# Patient Record
Sex: Male | Born: 1985 | Race: White | Hispanic: No | Marital: Married | Smoking: Current some day smoker
Health system: Southern US, Community
[De-identification: ages and names within clinical notes are randomized; demographics above are authoritative.]

## PROBLEM LIST (undated history)

## (undated) DIAGNOSIS — B009 Herpesviral infection, unspecified: Secondary | ICD-10-CM

## (undated) DIAGNOSIS — K219 Gastro-esophageal reflux disease without esophagitis: Secondary | ICD-10-CM

## (undated) DIAGNOSIS — F329 Major depressive disorder, single episode, unspecified: Secondary | ICD-10-CM

## (undated) DIAGNOSIS — F32A Depression, unspecified: Secondary | ICD-10-CM

## (undated) DIAGNOSIS — F419 Anxiety disorder, unspecified: Secondary | ICD-10-CM

## (undated) HISTORY — DX: Herpesviral infection, unspecified: B00.9

## (undated) HISTORY — PX: SHOULDER SURGERY: SHX246

## (undated) HISTORY — DX: Anxiety disorder, unspecified: F41.9

## (undated) HISTORY — DX: Gastro-esophageal reflux disease without esophagitis: K21.9

## (undated) HISTORY — PX: HAND SURGERY: SHX662

---

## 2016-09-28 ENCOUNTER — Emergency Department
Admission: EM | Admit: 2016-09-28 | Discharge: 2016-09-28 | Disposition: A | Discharge status: Home / Self Care | Payer: Self-pay | Source: Home / Self Care | Attending: Family Medicine | Admitting: Family Medicine

## 2016-09-28 ENCOUNTER — Emergency Department (INDEPENDENT_AMBULATORY_CARE_PROVIDER_SITE_OTHER): Payer: Self-pay

## 2016-09-28 DIAGNOSIS — S93401A Sprain of unspecified ligament of right ankle, initial encounter: Secondary | ICD-10-CM

## 2016-09-28 DIAGNOSIS — M79671 Pain in right foot: Secondary | ICD-10-CM

## 2016-09-28 DIAGNOSIS — M25571 Pain in right ankle and joints of right foot: Secondary | ICD-10-CM

## 2016-09-28 HISTORY — DX: Depression, unspecified: F32.A

## 2016-09-28 HISTORY — DX: Major depressive disorder, single episode, unspecified: F32.9

## 2016-09-28 NOTE — ED Provider Notes (Signed)
CSN: [id number]     Arrival date & time 09/28/16  1712 History   First MD Initiated Contact with Patient 09/28/16 1734     Chief Complaint  Patient presents with  . Ankle Pain   (Consider location/radiation/quality/duration/timing/severity/associated sxs/prior Treatment) HPI  Michael Manning is a 31 y.o. male presenting to UC with c/o Right ankle and foot pain that has been intermittent for about 2 weeks.  He reports initially rolling his ankle after stepping on a rock.  This morning he rolled it on the stairs.  Pain is aching and sore, 7/10. worse with ambulation.  He has tried tylenol and motrin with mild relief. No other injuries. No hx of ankle fracture or surgery in the past.    Past Medical History:  Diagnosis Date  . Depression    Past Surgical History:  Procedure Laterality Date  . HAND SURGERY    . SHOULDER SURGERY     Family History  Problem Relation Age of Onset  . Cancer Mother   . Depression Mother   . Hypertension Father   . Restless legs syndrome Father   . Depression Father   . Epilepsy Sister   . Depression Sister    Social History  Substance Use Topics  . Smoking status: Current Some Day Smoker  . Smokeless tobacco: Not on file  . Alcohol use 1.2 oz/week    2 Standard drinks or equivalent per week    Review of Systems  Musculoskeletal: Positive for arthralgias, joint swelling and myalgias. Negative for gait problem.  Skin: Negative for color change and wound.  Neurological: Negative for weakness and numbness.    Allergies  Klonopin [clonazepam]  Home Medications   Prior to Admission medications   Medication Sig Start Date End Date Taking? Authorizing Provider  busPIRone (BUSPAR) 15 MG tablet Take 15 mg by mouth 3 (three) times daily.   Yes [provider]  omeprazole (PRILOSEC) 20 MG capsule Take 20 mg by mouth daily.   Yes [provider]  sertraline (ZOLOFT) 100 MG tablet Take 100 mg by mouth daily.   Yes [provider]   valACYclovir (VALTREX) 500 MG tablet Take 500 mg by mouth 2 (two) times daily.   Yes [provider]   Meds Ordered and Administered this Visit  Medications - No data to display  BP 125/90 (BP Location: Left Arm)   Pulse 95   Temp 97.8 F (36.6 C) (Oral)   Resp 18   Ht 6\' 2"  (1.88 m)   Wt 297 lb 8 oz (134.9 kg)   SpO2 100%   BMI 38.20 kg/m  No data found.   Physical Exam  Constitutional: He is oriented to person, place, and time. He appears well-developed and well-nourished. No distress.  HENT:  Head: Normocephalic and atraumatic.  Eyes: EOM are normal.  Neck: Normal range of motion.  Cardiovascular: Normal rate.   Pulses:      Dorsalis pedis pulses are 2+ on the right side.       Posterior tibial pulses are 2+ on the right side.  Pulmonary/Chest: Effort normal.  Musculoskeletal: Normal range of motion. He exhibits tenderness. He exhibits no edema.       Feet:  Right ankle and foot: tenderness to medial, alteral and dorsal ankle joint. No obvious edema compared to Left. Full ROM.  Calf is soft, non-tender.   Neurological: He is alert and oriented to person, place, and time.  Skin: Skin is warm and dry. Capillary refill takes  less than 2 seconds. No rash noted. He is not diaphoretic. No erythema.  Right ankle and foot: skin in tact. No ecchymosis or erythema   Psychiatric: He has a normal mood and affect. His behavior is normal.  Nursing note and vitals reviewed.   Urgent Care Course     Procedures (including critical care time)  Labs Review Labs Reviewed - No data to display  Imaging Review Dg Ankle Complete Right  Result Date: 09/28/2016 CLINICAL DATA:  Right foot/ankle injury 1 week ago, pain EXAM: RIGHT ANKLE - COMPLETE 3+ VIEW COMPARISON:  None. FINDINGS: No fracture or dislocation is seen. The ankle mortise is intact. The base of the fifth metatarsal is unremarkable. The visualized soft tissues are unremarkable. IMPRESSION: Negative. Electronically  Signed   By: Charline BillsSriyesh  Krishnan M.D.   On: 09/28/2016 18:01   Dg Foot Complete Right  Result Date: 09/28/2016 CLINICAL DATA:  Right foot/ ankle injury 1 week ago, pain EXAM: RIGHT FOOT COMPLETE - 3+ VIEW COMPARISON:  None. FINDINGS: No fracture or dislocation is seen. The joint spaces are preserved. The visualized soft tissues are unremarkable. IMPRESSION: Negative. Electronically Signed   By: Charline BillsSriyesh  Krishnan M.D.   On: 09/28/2016 18:00     MDM   1. Sprain of right ankle, unspecified ligament, initial encounter   2. Right foot pain   3. Right ankle pain    Hx and exam with imaging c/w Right ankle sprain.  Ankle brace applied for comfort. Pt declined crutches.  Home care instructions for ankle sprain provided.   F/u with Sports Medicine in 1-2 weeks if not improving, sooner if worsening.    Junius FinnerO'Malley, Dara Camargo, PA-C 09/28/16 Rickey [id number]

## 2016-09-28 NOTE — ED Triage Notes (Signed)
Pt with right foot/ankle pain that's been on and off for 2 weeks. States he initially rolled his ankle and landed on it 2 weeks ago, but he keeps rolling it and pain is increasing.

## 2018-05-15 IMAGING — DX DG ANKLE COMPLETE 3+V*R*
3 series · 3 of 3 positions shown · non-contrast
Comparison: None.

CLINICAL DATA: Right foot/ankle injury 1 week ago, pain

EXAM:
RIGHT ANKLE - COMPLETE 3+ VIEW

[ankle ap]
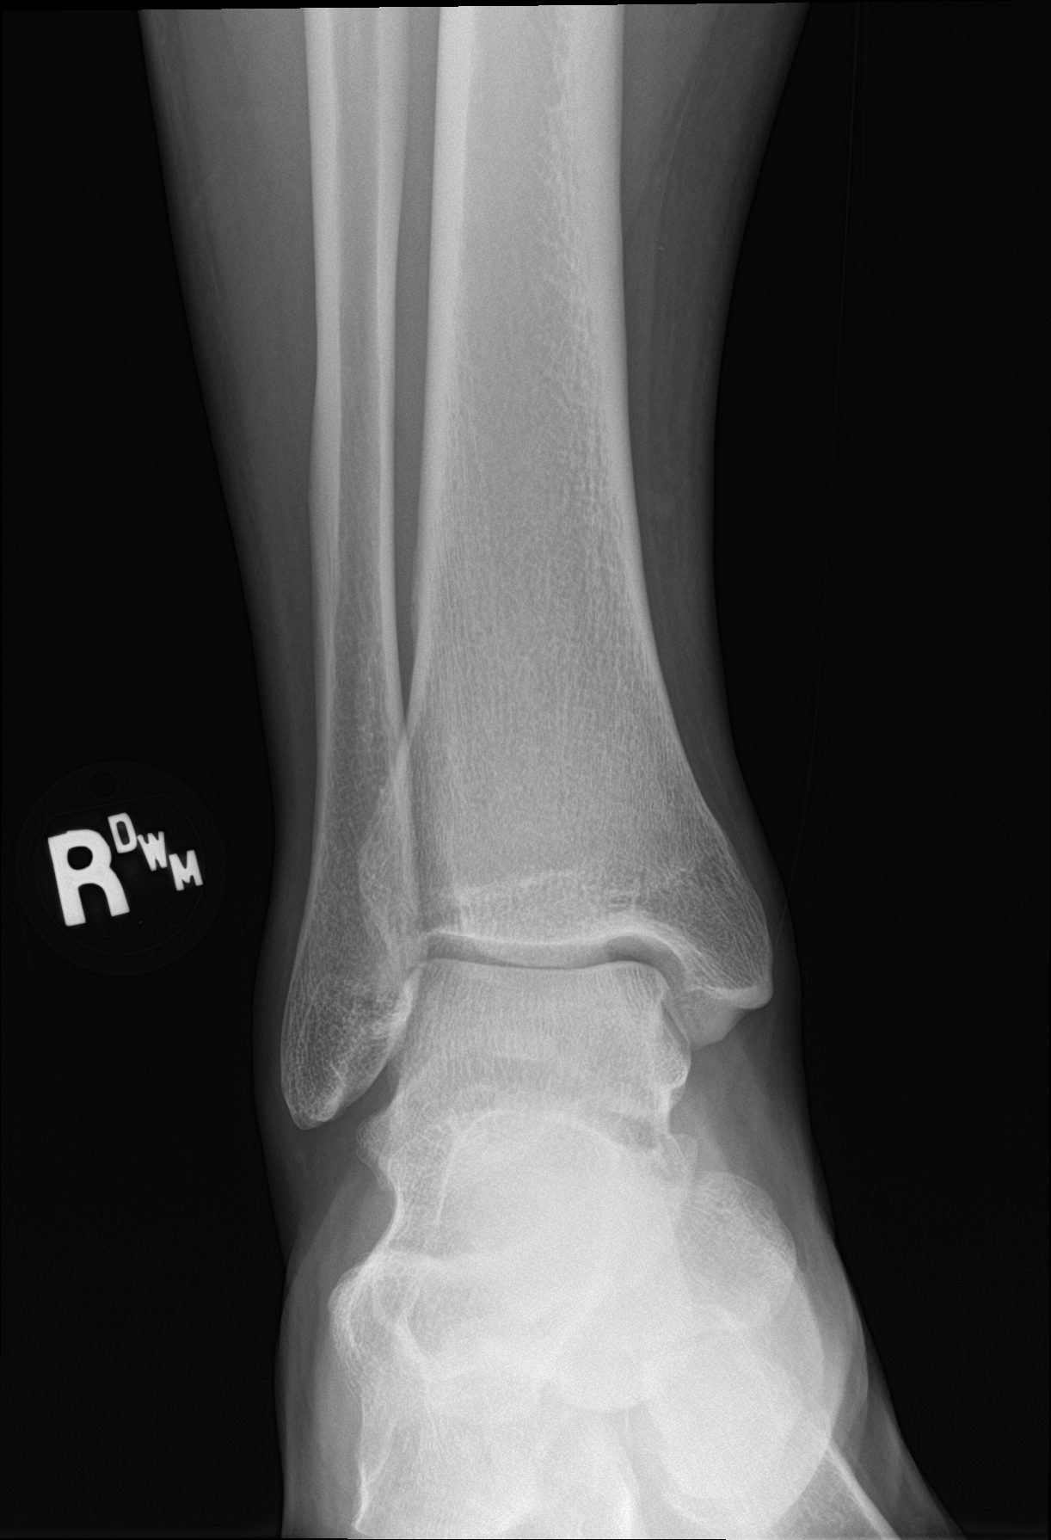

[ankle obl]
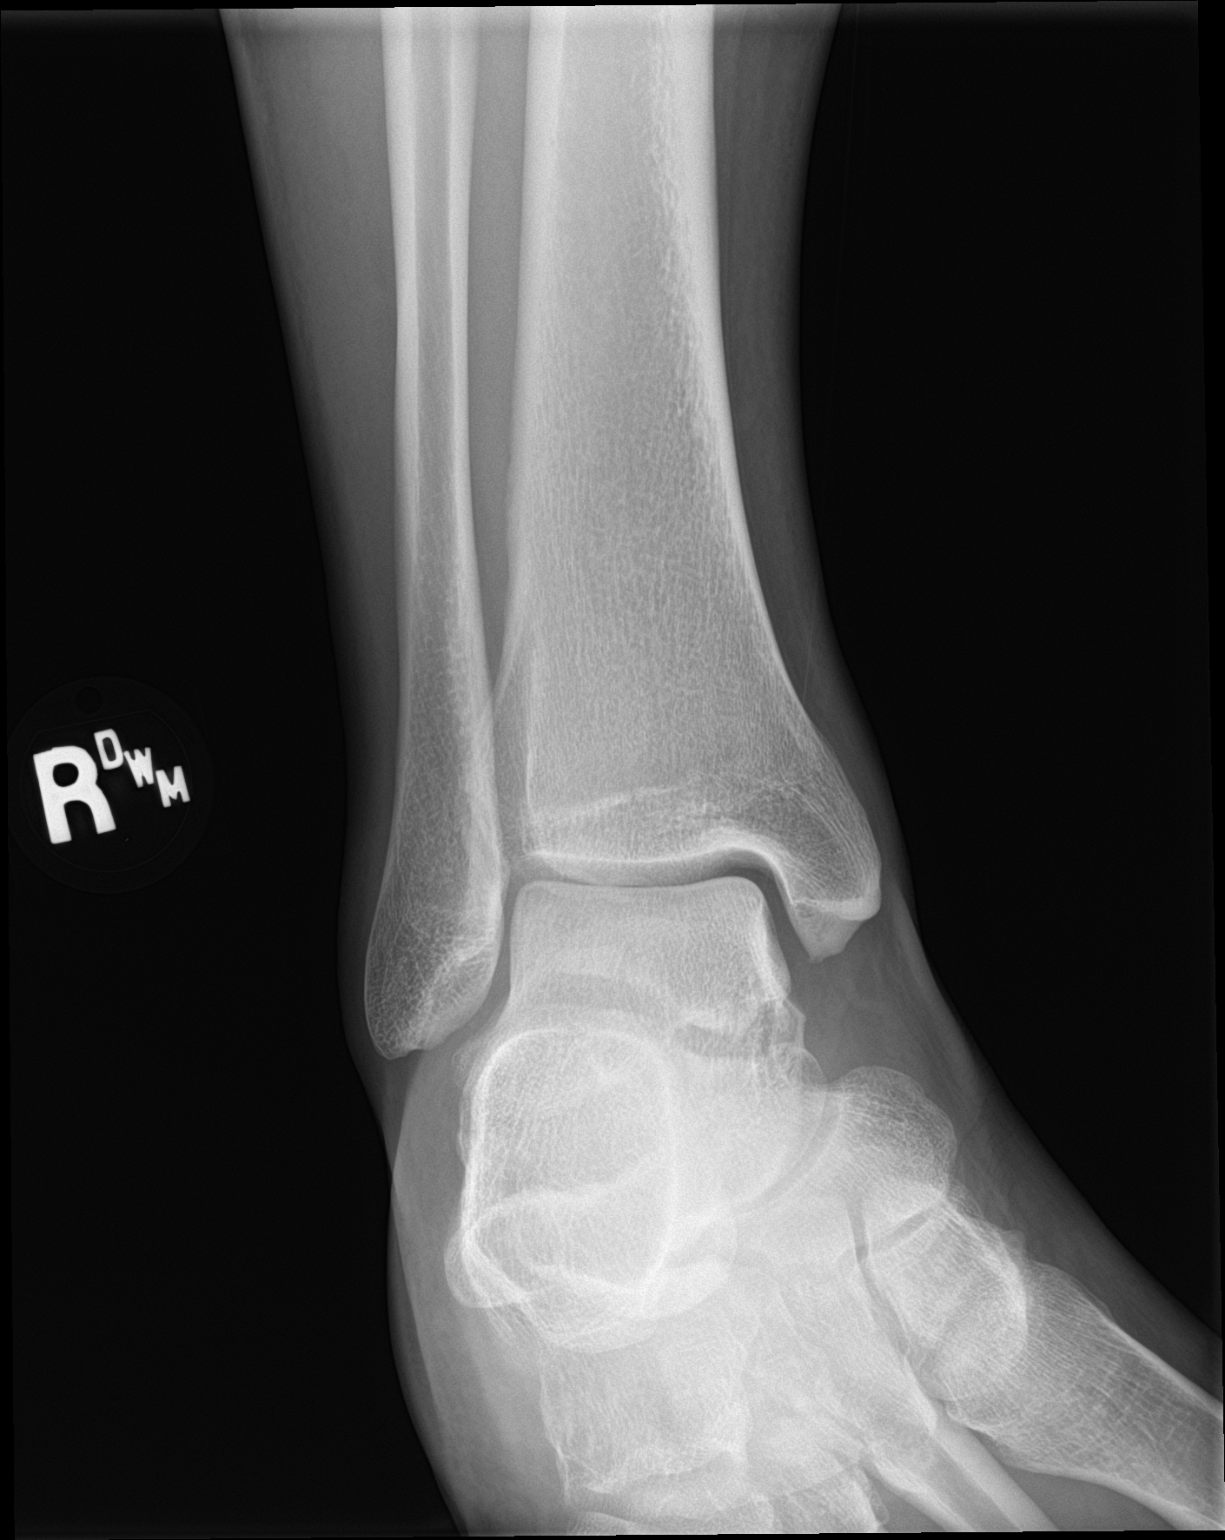

[ankle lat]
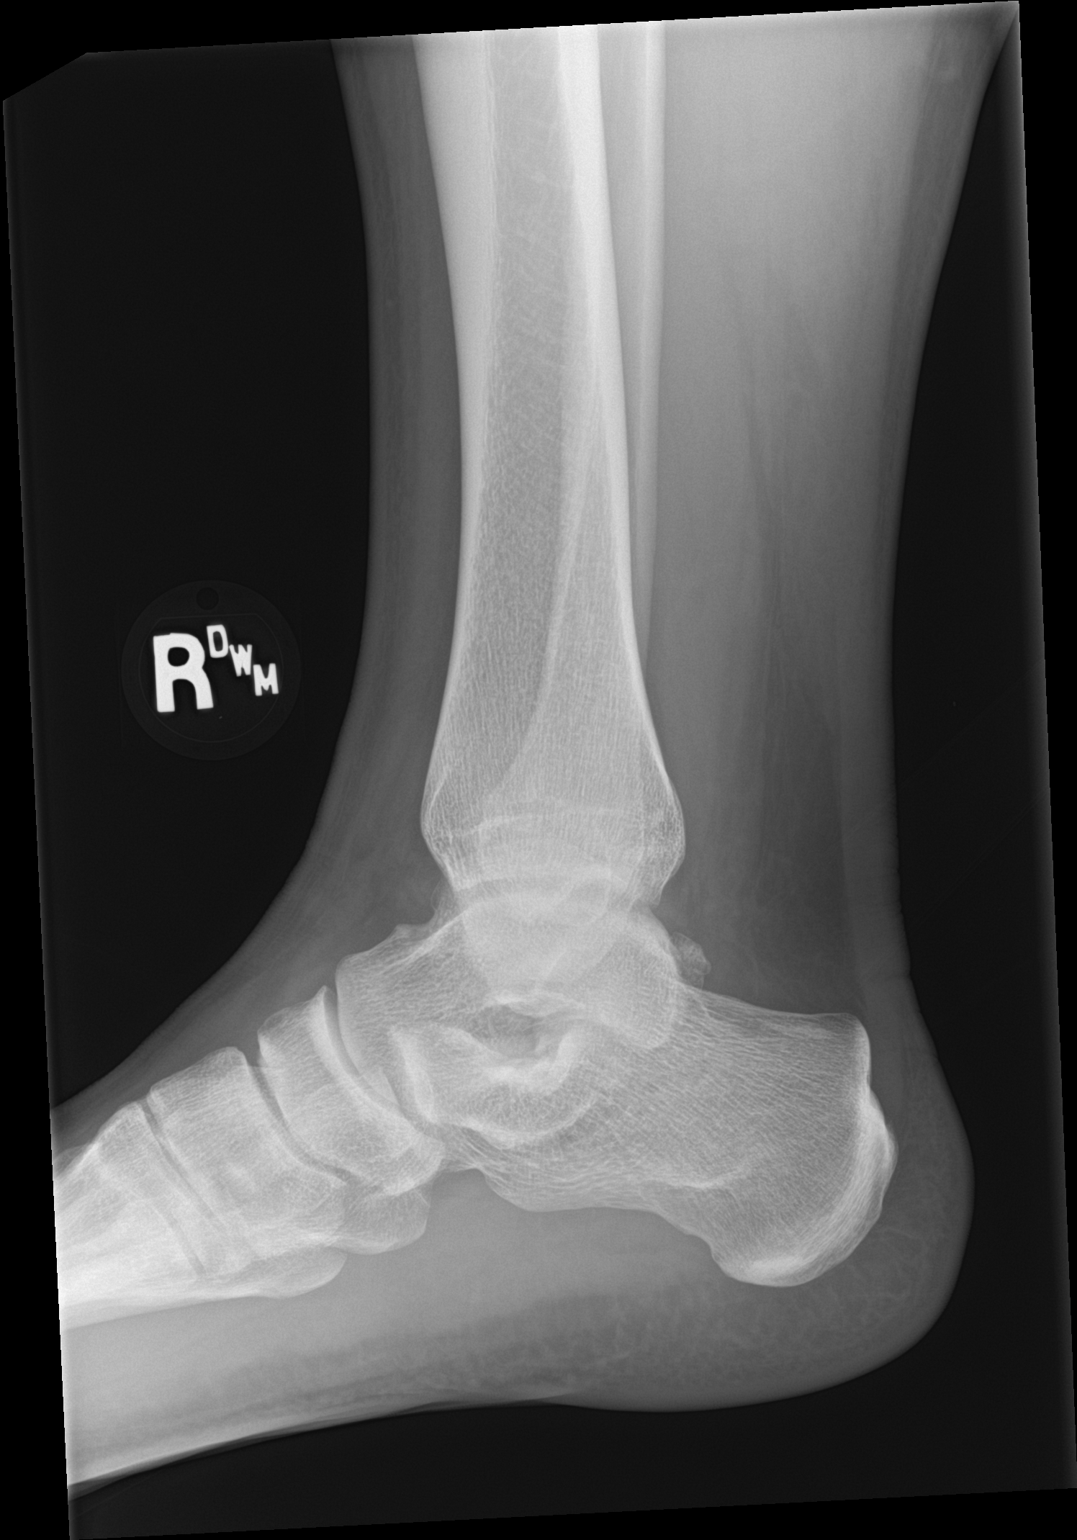

[3 of 3 positions shown; findings below may reference images not displayed]

FINDINGS: No fracture or dislocation is seen.

The ankle mortise is intact.

The base of the fifth metatarsal is unremarkable.

The visualized soft tissues are unremarkable.
IMPRESSION: Negative.

## 2018-05-15 IMAGING — DX DG FOOT COMPLETE 3+V*R*
3 series · 3 of 3 positions shown · non-contrast
Comparison: None.

CLINICAL DATA: Right foot/ ankle injury 1 week ago, pain

EXAM:
RIGHT FOOT COMPLETE - 3+ VIEW

[foot ap]
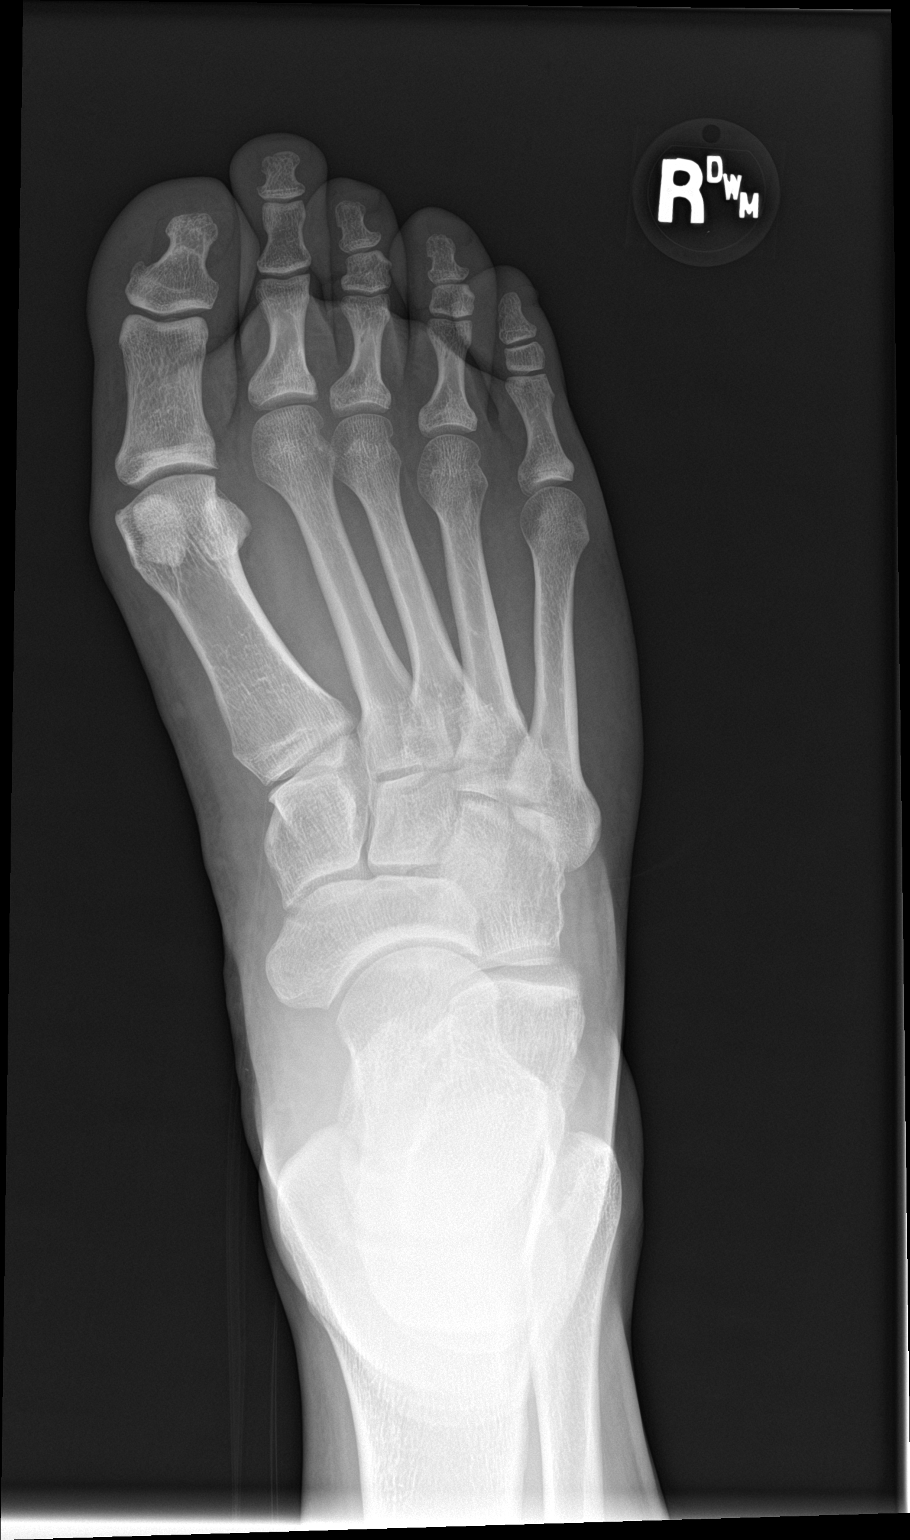

[foot obl]
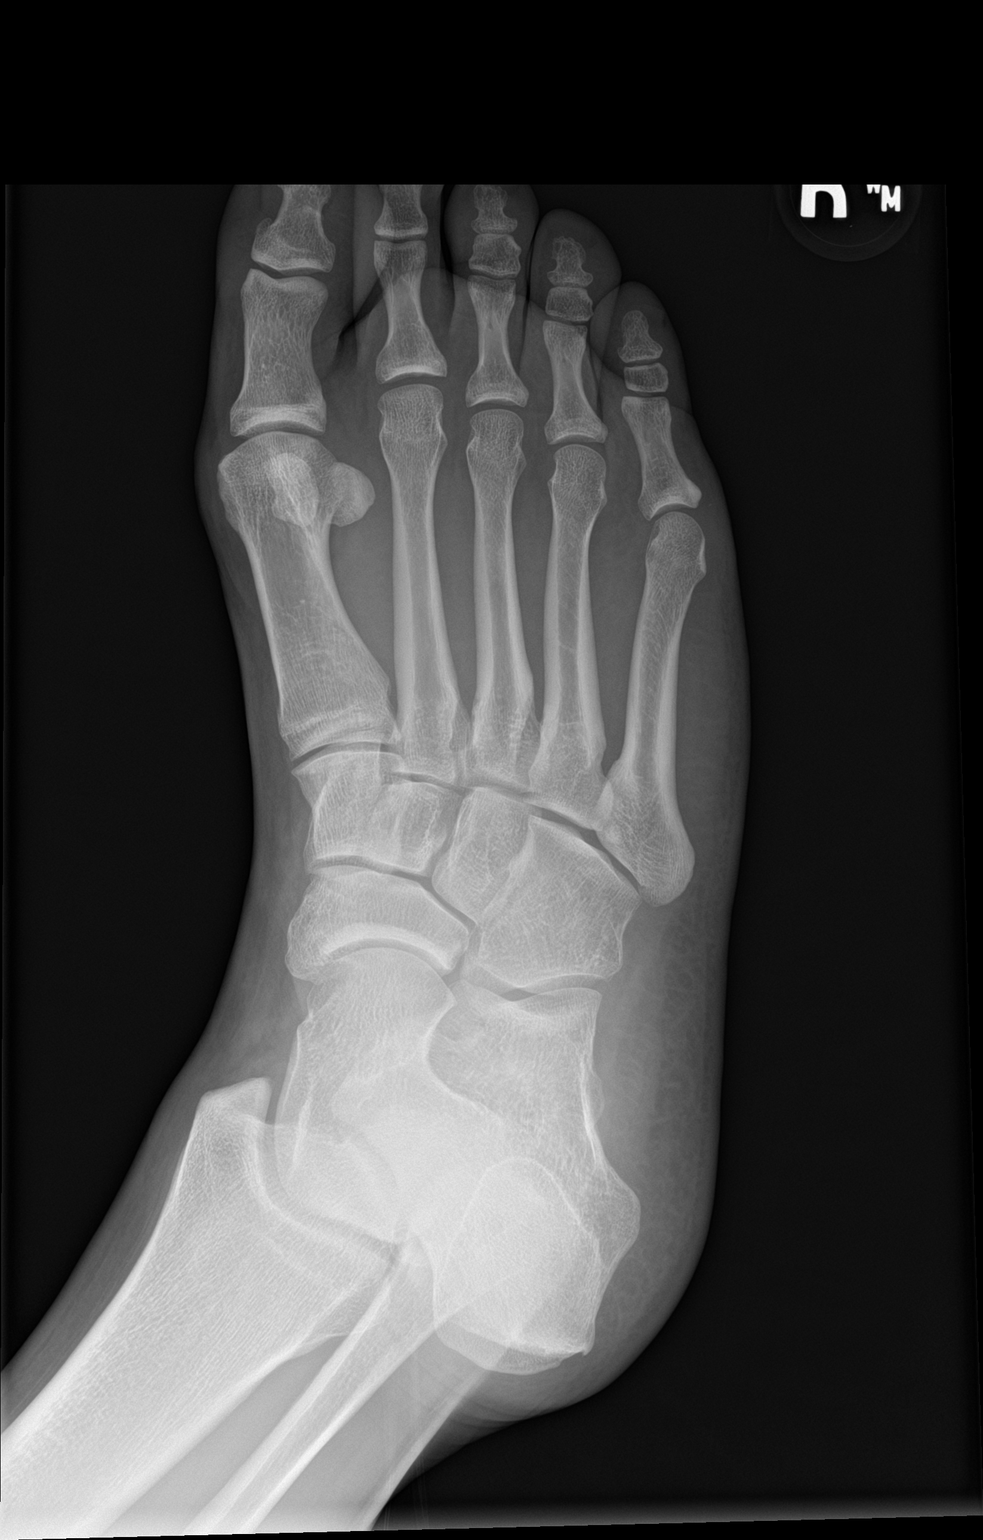

[foot lat]
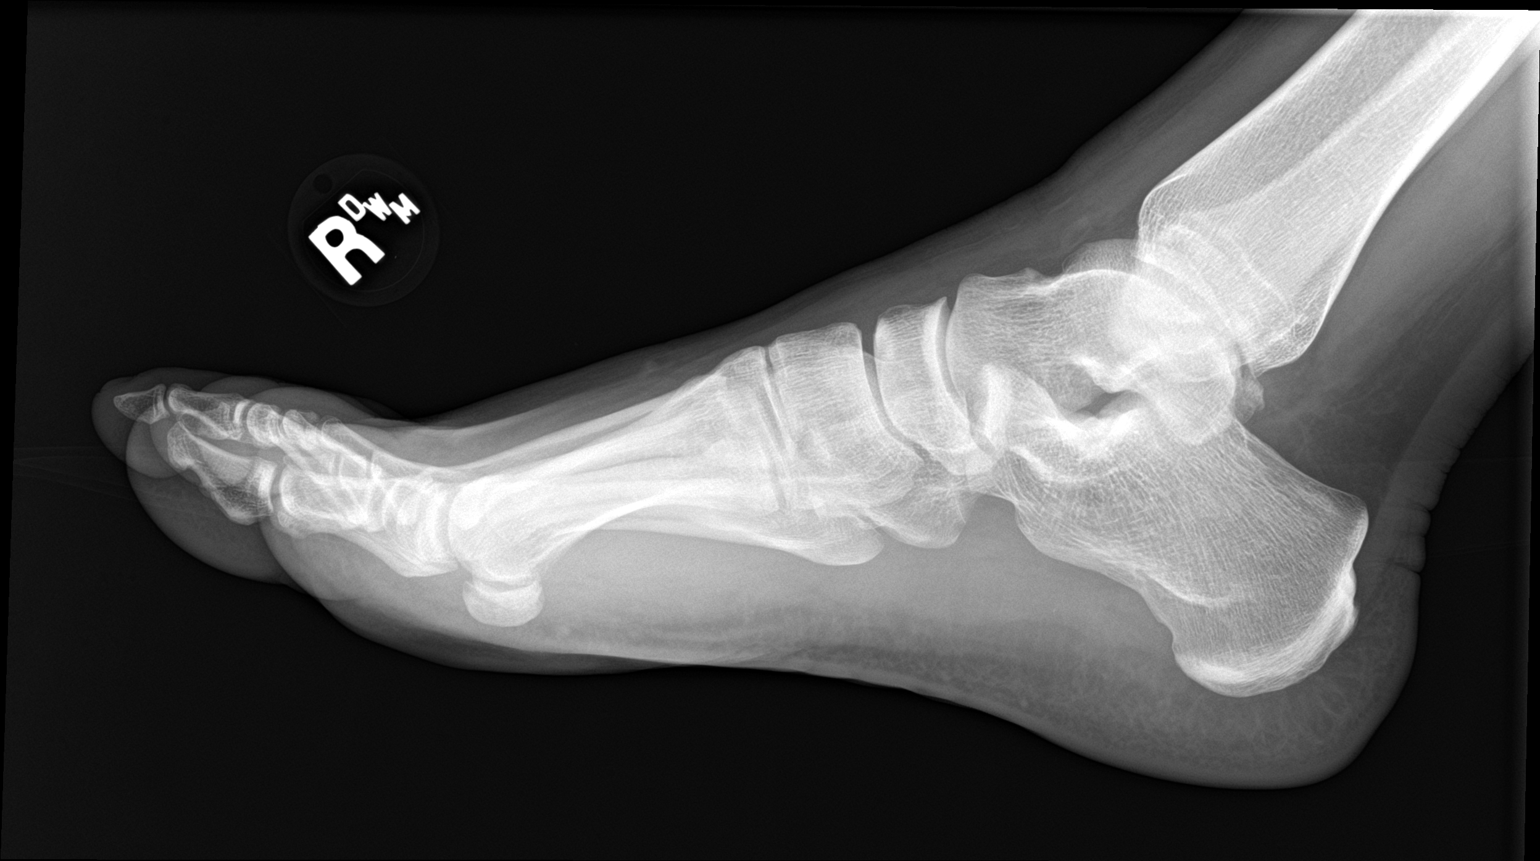

[3 of 3 positions shown; findings below may reference images not displayed]

FINDINGS: No fracture or dislocation is seen.

The joint spaces are preserved.

The visualized soft tissues are unremarkable.
IMPRESSION: Negative.

## 2020-03-04 DIAGNOSIS — Z8616 Personal history of COVID-19: Secondary | ICD-10-CM | POA: Insufficient documentation

## 2020-05-21 ENCOUNTER — Ambulatory Visit (INDEPENDENT_AMBULATORY_CARE_PROVIDER_SITE_OTHER): Payer: 59 | Admitting: Medical-Surgical

## 2020-05-21 ENCOUNTER — Other Ambulatory Visit: Payer: Self-pay

## 2020-05-21 ENCOUNTER — Encounter: Payer: Self-pay | Admitting: Medical-Surgical

## 2020-05-21 VITALS — BP 138/85 | HR 78 | Temp 97.8°F | Ht 72.5 in | Wt 300.6 lb

## 2020-05-21 DIAGNOSIS — R1010 Upper abdominal pain, unspecified: Secondary | ICD-10-CM

## 2020-05-21 DIAGNOSIS — F418 Other specified anxiety disorders: Secondary | ICD-10-CM

## 2020-05-21 DIAGNOSIS — Z7689 Persons encountering health services in other specified circumstances: Secondary | ICD-10-CM | POA: Diagnosis not present

## 2020-05-21 DIAGNOSIS — M79651 Pain in right thigh: Secondary | ICD-10-CM

## 2020-05-21 DIAGNOSIS — J329 Chronic sinusitis, unspecified: Secondary | ICD-10-CM | POA: Diagnosis not present

## 2020-05-21 MED ORDER — SERTRALINE HCL 100 MG PO TABS: 100.0000 mg | ORAL_TABLET | Freq: Every day | ORAL | 1 refills | Status: DC

## 2020-05-21 MED ORDER — BUPROPION HCL ER (XL) 150 MG PO TB24: 150.0000 mg | ORAL_TABLET | ORAL | 0 refills | Status: DC

## 2020-05-21 NOTE — Progress Notes (Signed)
New Patient Office Visit  Subjective:  Patient ID: Michael Manning, male    DOB: Jan 18, 1986  Age: 35 y.o. MRN: [id number]  CC:  Chief Complaint  Patient presents with  . Establish Care    HPI Michael Manning presents to establish care.  Anxiety/Depression- taking Zoloft 100mg  daily, tolerating well. Also taking Wellbutrin 75mg  BID. Admits that he sometimes forgets the second dose of the day. No side effects from the medications. Feels this regimen works fairly well for him. Has had quite a bit of family stress and upheaval over the past year but is working to get them relocated to [facility] from . Currently in the process of looking for a house   RUQ pain- history of RUQ pain with some LUQ pain as well. Uses Beano which is helpful at times. Also takes Omeprazole BID to manage his symptoms. Pain is intermittent but has been flaring up lately. Would like to have labs checked to make sure he is ok. Had a RUQ ultrasound years ago which was ok. Feels his symptoms are related to his gallbladder.   Chronic sinus issues- has a history of multiple sinus issues per year that has been going on for many year. Has been told he needs to be evaluated by ENT. Would like a referral today if possible.   Right thigh pain- history of multiple hernias with surgical repair. Has noticed an intermittent discomfort in his right inner thigh that has been going on for a while. No bulging or continuous pain. Feels this might be related to a muscle issue.    Past Medical History:  Diagnosis Date  . Anxiety   . Depression   . GERD (gastroesophageal reflux disease)     Past Surgical History:  Procedure Laterality Date  . HAND SURGERY    . SHOULDER SURGERY      Family History  Problem Relation Age of Onset  . Cancer Mother   . Depression Mother   . Hypertension Father   . Restless legs syndrome Father   . Depression Father   . Epilepsy Sister   . Depression Sister     Social History   Socioeconomic  History  . Marital status: Married    Spouse name: Not on file  . Number of children: Not on file  . Years of education: Not on file  . Highest education level: Not on file  Occupational History  . Not on file  Tobacco Use  . Smoking status: Current Some Day Smoker  . Smokeless tobacco: Current User    Types: Chew  Vaping Use  . Vaping Use: Some days  Substance and Sexual Activity  . Alcohol use: Yes    Comment: Rarely  . Drug use: Not Currently    Types: Marijuana  . Sexual activity: Yes  Other Topics Concern  . Not on file  Social History Narrative  . Not on file   Social Determinants of Health   Financial Resource Strain: Not on file  Food Insecurity: Not on file  Transportation Needs: Not on file  Physical Activity: Not on file  Stress: Not on file  Social Connections: Not on file  Intimate Partner Violence: Not on file    ROS Review of Systems  Constitutional: Negative for chills, fatigue, fever and unexpected weight change.  Respiratory: Positive for cough. Negative for chest tightness, shortness of breath and wheezing.   Cardiovascular: Negative for chest pain, palpitations and leg swelling.  Gastrointestinal: Positive for abdominal pain and constipation. Negative  for diarrhea and nausea.  Neurological: Negative for dizziness, light-headedness and headaches.  Psychiatric/Behavioral: Positive for dysphoric mood. Negative for self-injury, sleep disturbance and suicidal ideas. The patient is nervous/anxious.     Objective:   Today's Vitals: BP 138/85   Pulse 78   Temp 97.8 F (36.6 C)   Ht 6' 0.5" (1.842 m)   Wt (!) 300 lb 9.6 oz (136.4 kg)   SpO2 99%   BMI 40.21 kg/m   Physical Exam Vitals and nursing note reviewed.  Constitutional:      General: He is not in acute distress.    Appearance: Normal appearance. He is obese. He is not ill-appearing.  HENT:     Head: Normocephalic and atraumatic.  Cardiovascular:     Rate and Rhythm: Normal rate and  regular rhythm.     Pulses: Normal pulses.     Heart sounds: Normal heart sounds. No murmur heard. No friction rub. No gallop.   Pulmonary:     Effort: Pulmonary effort is normal. No respiratory distress.     Breath sounds: Normal breath sounds.  Abdominal:     General: Bowel sounds are normal. There is no distension.     Palpations: Abdomen is soft. There is no mass.     Tenderness: There is abdominal tenderness (mild diffuse tenderness). There is no guarding or rebound.     Hernia: No hernia is present.  Skin:    General: Skin is warm and dry.  Neurological:     Mental Status: He is alert and oriented to person, place, and time.  Psychiatric:        Mood and Affect: Mood normal.        Behavior: Behavior normal.        Thought Content: Thought content normal.        Judgment: Judgment normal.     Assessment & Plan:   1. Encounter to establish care Reviewed available information. Due for annual physical exam.   2. Anxiety with depression Continue Zoloft 100mg  daily. Changing Wellbutrin to 150mg  XL daily so that he doesn't need to remember the second dose and will get the full therapy. Patient agreeable to the change. Refills sent.   3. Pain of upper abdomen Checking labs as below. If labs look ok, will proceed with RUQ for further evaluation. Discussed dietary triggers and recommend avoidance. Continue weight loss.  - COMPLETE METABOLIC PANEL WITH GFR - Lipid panel - Amylase - Lipase - CBC with Differential/Platelet - Fe+TIBC+Fer  4. Chronic congestion of paranasal sinus Referring to ENT at patient request.  - Ambulatory referral to ENT  5. Right thigh pain Suspect MSK etiology given the mild and intermittent nature of the discomfort. If it worsens or becomes persistent, return for further evaluation.   Outpatient Encounter Medications as of 05/21/2020  Medication Sig  . buPROPion (WELLBUTRIN XL) 150 MG 24 hr tablet Take 1 tablet (150 mg total) by mouth every  morning.  . fexofenadine (ALLEGRA) 180 MG tablet Take 180 mg by mouth daily.  . Multiple Vitamin tablet Take 1 tablet by mouth daily.  . Multiple Vitamins-Minerals (EMERGEN-C IMMUNE PO) Take by mouth daily.  [place] omeprazole (PRILOSEC) 20 MG capsule Take 20 mg by mouth daily.  . [DISCONTINUED] buPROPion (WELLBUTRIN) 75 MG tablet Take 75 mg by mouth 2 (two) times daily.  . [DISCONTINUED] sertraline (ZOLOFT) 100 MG tablet Take 100 mg by mouth daily.  . sertraline (ZOLOFT) 100 MG tablet Take 1 tablet (100 mg total) by  mouth daily.  . [DISCONTINUED] busPIRone (BUSPAR) 15 MG tablet Take 15 mg by mouth 3 (three) times daily.  . [DISCONTINUED] valACYclovir (VALTREX) 500 MG tablet Take 500 mg by mouth 2 (two) times daily.   No facility-administered encounter medications on file as of 05/21/2020.    Follow-up: Return for annual physical exam at your convenience.   Thayer Ohm, DNP, APRN, FNP-BC [facility]

## 2020-05-22 LAB — COMPLETE METABOLIC PANEL WITH GFR
AG Ratio: 1.8 (calc) (ref 1.0–2.5)
ALT: 24 U/L (ref 9–46)
AST: 14 U/L (ref 10–40)
Albumin: 4.8 g/dL (ref 3.6–5.1)
Alkaline phosphatase (APISO): 64 U/L (ref 36–130)
BUN: 13 mg/dL (ref 7–25)
CO2: 28 mmol/L (ref 20–32)
Calcium: 10 mg/dL (ref 8.6–10.3)
Chloride: 107 mmol/L (ref 98–110)
Creat: 0.75 mg/dL (ref 0.60–1.35)
GFR, Est African American: 140 mL/min/{1.73_m2} (ref >=60)
GFR, Est Non African American: 121 mL/min/{1.73_m2} (ref >=60)
Globulin: 2.7 g/dL (calc) (ref 1.9–3.7)
Glucose, Bld: 84 mg/dL (ref 65–99)
Potassium: 4.5 mmol/L (ref 3.5–5.3)
Sodium: 144 mmol/L (ref 135–146)
Total Bilirubin: 0.5 mg/dL (ref 0.2–1.2)
Total Protein: 7.5 g/dL (ref 6.1–8.1)

## 2020-05-22 LAB — CBC WITH DIFFERENTIAL/PLATELET
Absolute Monocytes: 711 {cells}/uL (ref 200–950)
Basophils Absolute: 62 {cells}/uL (ref 0–200)
Basophils Relative: 0.6 %
Eosinophils Absolute: 494 {cells}/uL (ref 15–500)
Eosinophils Relative: 4.8 %
HCT: 47.5 % (ref 38.5–50.0)
Hemoglobin: 16.3 g/dL (ref 13.2–17.1)
Lymphs Abs: 2256 {cells}/uL (ref 850–3900)
MCH: 28.8 pg (ref 27.0–33.0)
MCHC: 34.3 g/dL (ref 32.0–36.0)
MCV: 83.9 fL (ref 80.0–100.0)
MPV: 10.3 fL (ref 7.5–12.5)
Monocytes Relative: 6.9 %
Neutro Abs: 6777 {cells}/uL (ref 1500–7800)
Neutrophils Relative %: 65.8 %
Platelets: 340 Thousand/uL (ref 140–400)
RBC: 5.66 Million/uL (ref 4.20–5.80)
RDW: 13 % (ref 11.0–15.0)
Total Lymphocyte: 21.9 %
WBC: 10.3 Thousand/uL (ref 3.8–10.8)

## 2020-05-22 LAB — LIPID PANEL
Cholesterol: 202 mg/dL — ABNORMAL HIGH
HDL: 28 mg/dL — ABNORMAL LOW
LDL Cholesterol (Calc): 131 mg/dL — ABNORMAL HIGH
Non-HDL Cholesterol (Calc): 174 mg/dL — ABNORMAL HIGH
Total CHOL/HDL Ratio: 7.2 (calc) — ABNORMAL HIGH
Triglycerides: 280 mg/dL — ABNORMAL HIGH

## 2020-05-22 LAB — AMYLASE: Amylase: 22 U/L (ref 21–101)

## 2020-05-22 LAB — IRON,TIBC AND FERRITIN PANEL
%SAT: 31 % (ref 20–48)
Ferritin: 248 ng/mL (ref 38–380)
Iron: 98 ug/dL (ref 50–180)
TIBC: 318 ug/dL (ref 250–425)

## 2020-05-22 LAB — LIPASE: Lipase: 22 U/L (ref 7–60)

## 2020-05-22 NOTE — Addendum Note (Signed)
Addended byChristen Butter on: 05/22/2020 07:47 AM   Modules accepted: Orders

## 2020-05-24 ENCOUNTER — Other Ambulatory Visit: Payer: Self-pay

## 2020-05-24 ENCOUNTER — Ambulatory Visit (INDEPENDENT_AMBULATORY_CARE_PROVIDER_SITE_OTHER): Payer: 59

## 2020-05-24 DIAGNOSIS — R1010 Upper abdominal pain, unspecified: Secondary | ICD-10-CM

## 2020-05-31 ENCOUNTER — Encounter: Payer: Self-pay | Admitting: Medical-Surgical

## 2020-06-04 ENCOUNTER — Encounter: Payer: Self-pay | Admitting: Medical-Surgical

## 2020-06-04 ENCOUNTER — Other Ambulatory Visit: Payer: Self-pay

## 2020-06-04 ENCOUNTER — Ambulatory Visit (INDEPENDENT_AMBULATORY_CARE_PROVIDER_SITE_OTHER): Payer: Self-pay | Admitting: Medical-Surgical

## 2020-06-04 VITALS — BP 123/89 | HR 82 | Temp 98.6°F | Ht 72.0 in | Wt 299.9 lb

## 2020-06-04 DIAGNOSIS — Z0000 Encounter for general adult medical examination without abnormal findings: Secondary | ICD-10-CM

## 2020-06-04 DIAGNOSIS — F418 Other specified anxiety disorders: Secondary | ICD-10-CM

## 2020-06-04 DIAGNOSIS — Z1159 Encounter for screening for other viral diseases: Secondary | ICD-10-CM

## 2020-06-04 DIAGNOSIS — Z114 Encounter for screening for human immunodeficiency virus [HIV]: Secondary | ICD-10-CM

## 2020-06-04 DIAGNOSIS — Z23 Encounter for immunization: Secondary | ICD-10-CM

## 2020-06-04 NOTE — Patient Instructions (Addendum)
Tdap (Tetanus, Diphtheria, Pertussis) Vaccine: What You Need to Know 1. Why get vaccinated? Tdap vaccine can prevent tetanus, diphtheria, and pertussis. Diphtheria and pertussis spread from person to person. Tetanus enters the body through cuts or wounds.  TETANUS (T) causes painful stiffening of the muscles. Tetanus can lead to serious health problems, including being unable to open the mouth, having trouble swallowing and breathing, or death.  DIPHTHERIA (D) can lead to difficulty breathing, heart failure, paralysis, or death.  PERTUSSIS (aP), also known as "whooping cough," can cause uncontrollable, violent coughing that makes it hard to breathe, eat, or drink. Pertussis can be extremely serious especially in babies and young children, causing pneumonia, convulsions, brain damage, or death. In teens and adults, it can cause weight loss, loss of bladder control, passing out, and rib fractures from severe coughing. 2. Tdap vaccine Tdap is only for children 7 years and older, adolescents, and adults.  Adolescents should receive a single dose of Tdap, preferably at age 11 or 12 years. Pregnant people should get a dose of Tdap during every pregnancy, preferably during the early part of the third trimester, to help protect the newborn from pertussis. Infants are most at risk for severe, life-threatening complications from pertussis. Adults who have never received Tdap should get a dose of Tdap. Also, adults should receive a booster dose of either Tdap or Td (a different vaccine that protects against tetanus and diphtheria but not pertussis) every 10 years, or after 5 years in the case of a severe or dirty wound or burn. Tdap may be given at the same time as other vaccines. 3. Talk with your health care provider Tell your vaccine provider if the person getting the vaccine:  Has had an allergic reaction after a previous dose of any vaccine that protects against tetanus, diphtheria, or pertussis, or  has any severe, life-threatening allergies  Has had a coma, decreased level of consciousness, or prolonged seizures within 7 days after a previous dose of any pertussis vaccine (DTP, DTaP, or Tdap)  Has seizures or another nervous system problem  Has ever had Guillain-Barr Syndrome (also called "GBS")  Has had severe pain or swelling after a previous dose of any vaccine that protects against tetanus or diphtheria In some cases, your health care provider may decide to postpone Tdap vaccination until a future visit. People with minor illnesses, such as a cold, may be vaccinated. People who are moderately or severely ill should usually wait until they recover before getting Tdap vaccine.  Your health care provider can give you more information. 4. Risks of a vaccine reaction  Pain, redness, or swelling where the shot was given, mild fever, headache, feeling tired, and nausea, vomiting, diarrhea, or stomachache sometimes happen after Tdap vaccination. People sometimes faint after medical procedures, including vaccination. Tell your provider if you feel dizzy or have vision changes or ringing in the ears.  As with any medicine, there is a very remote chance of a vaccine causing a severe allergic reaction, other serious injury, or death. 5. What if there is a serious problem? An allergic reaction could occur after the vaccinated person leaves the clinic. If you see signs of a severe allergic reaction (hives, swelling of the face and throat, difficulty breathing, a fast heartbeat, dizziness, or weakness), call 9-1-1 and get the person to the nearest hospital. For other signs that concern you, call your health care provider.  Adverse reactions should be reported to the Vaccine Adverse Event Reporting System (VAERS). Your health   care provider will usually file this report, or you can do it yourself. Visit the VAERS website at www.vaers.LAgents.no or call 812-423-7343. VAERS is only for reporting  reactions, and VAERS staff members do not give medical advice. 6. The National Vaccine Injury Compensation Program The [employer] Vaccine Injury Compensation Program (VICP) is a federal program that was created to compensate people who may have been injured by certain vaccines. Claims regarding alleged injury or death due to vaccination have a time limit for filing, which may be as short as two years. Visit the VICP website at SpiritualWord.at or call 281-880-5801 to learn about the program and about filing a claim. 7. How can I learn more?  Ask your health care provider.  Call your local or state health department.  Visit the website of the Food and Drug Administration (FDA) for vaccine package inserts and additional information at FinderList.no.  Contact the Centers for Disease Control and Prevention (CDC): ? Call (323)301-7687 (1-800-CDC-INFO) or ? Visit CDC's website at PicCapture.uy. Vaccine Information Statement Tdap (Tetanus, Diphtheria, Pertussis) Vaccine (12/08/2019) This information is not intended to replace advice given to you by your health care provider. Make sure you discuss any questions you have with your health care provider. Document Revised: 01/03/2020 Document Reviewed: 01/03/2020 Elsevier Patient Education  2021 Elsevier Inc.   Preventive Care 77-11 Years Old, Male Preventive care refers to lifestyle choices and visits with your health care provider that can promote health and wellness. This includes:  A yearly physical exam. This is also called an annual wellness visit.  Regular dental and eye exams.  Immunizations.  Screening for certain conditions.  Healthy lifestyle choices, such as: ? Eating a healthy diet. ? Getting regular exercise. ? Not using drugs or products that contain nicotine and tobacco. ? Limiting alcohol use. What can I expect for my preventive care visit? Physical exam Your health  care provider will check your:  Height and weight. These may be used to calculate your BMI (body mass index). BMI is a measurement that tells if you are at a healthy weight.  Heart rate and blood pressure.  Body temperature.  Skin for abnormal spots. Counseling Your health care provider may ask you questions about your:  Past medical problems.  Family's medical history.  Alcohol, tobacco, and drug use.  Emotional well-being.  Home life and relationship well-being.  Sexual activity.  Diet, exercise, and sleep habits.  Work and work Astronomer.  Access to firearms. What immunizations do I need? Vaccines are usually given at various ages, according to a schedule. Your health care provider will recommend vaccines for you based on your age, medical history, and lifestyle or other factors, such as travel or where you work.   What tests do I need? Blood tests  Lipid and cholesterol levels. These may be checked every 5 years, or more often if you are over 72 years old.  Hepatitis C test.  Hepatitis B test. Screening  Lung cancer screening. You may have this screening every year starting at age 14 if you have a 30-pack-year history of smoking and currently smoke or have quit within the past 15 years.  Prostate cancer screening. Recommendations will vary depending on your family history and other risks.  Genital exam to check for testicular cancer or hernias.  Colorectal cancer screening. ? All adults should have this screening starting at age 57 and continuing until age 49. ? Your health care provider may recommend screening at age 16 if you are at increased  risk. ? You will have tests every 1-10 years, depending on your results and the type of screening test.  Diabetes screening. ? This is done by checking your blood sugar (glucose) after you have not eaten for a while (fasting). ? You may have this done every 1-3 years.  STD (sexually transmitted disease) testing, if  you are at risk. Follow these instructions at home: Eating and drinking  Eat a diet that includes fresh fruits and vegetables, whole grains, lean protein, and low-fat dairy products.  Take vitamin and mineral supplements as recommended by your health care provider.  Do not drink alcohol if your health care provider tells you not to drink.  If you drink alcohol: ? Limit how much you have to 0-2 drinks a day. ? Be aware of how much alcohol is in your drink. In the [place] one drink equals one 12 oz bottle of beer (355 mL), one 5 oz glass of wine (148 mL), or one 1 oz glass of hard liquor (44 mL).   Lifestyle  Take daily care of your teeth and gums. Brush your teeth every morning and night with fluoride toothpaste. Floss one time each day.  Stay active. Exercise for at least 30 minutes 5 or more days each week.  Do not use any products that contain nicotine or tobacco, such as cigarettes, e-cigarettes, and chewing tobacco. If you need help quitting, ask your health care provider.  Do not use drugs.  If you are sexually active, practice safe sex. Use a condom or other form of protection to prevent STIs (sexually transmitted infections).  If told by your health care provider, take low-dose aspirin daily starting at age 83.  Find healthy ways to cope with stress, such as: ? Meditation, yoga, or listening to music. ? Journaling. ? Talking to a trusted person. ? Spending time with friends and family. Safety  Always wear your seat belt while driving or riding in a vehicle.  Do not drive: ? If you have been drinking alcohol. Do not ride with someone who has been drinking. ? When you are tired or distracted. ? While texting.  Wear a helmet and other protective equipment during sports activities.  If you have firearms in your house, make sure you follow all gun safety procedures. What's next?  Go to your health care provider once a year for an annual wellness visit.  Ask your  health care provider how often you should have your eyes and teeth checked.  Stay up to date on all vaccines. This information is not intended to replace advice given to you by your health care provider. Make sure you discuss any questions you have with your health care provider. Document Revised: 01/17/2019 Document Reviewed: 04/14/2018 Elsevier Patient Education  2021 [employer].

## 2020-06-04 NOTE — Progress Notes (Signed)
HPI: Michael Manning is a 35 y.o. male who  has a past medical history of Anxiety, Depression, and GERD (gastroesophageal reflux disease).  [facility], 06/04/20,  for chief complaint of: Annual physical exam  Dentist: last about 3 years ago; severe phobia Eye exam: 1 year ago, waiting on new insurance card to set up next appt; glasses Exercise: 30-60 minutes daily, resistance bands, stair climbing, walking Diet: made some changes in the last couple of weeks, reduced sodas, lean proteins, healthy fats, fruits, veggies COVID vaccine: None,   Concerns: Wonders if testosterone may be low and if this could be contributing to his depression/anxiety.  Past medical, surgical, social and family history reviewed:  Patient Active Problem List   Diagnosis Date Noted  . Anxiety with depression 05/21/2020  . Chronic congestion of paranasal sinus 05/21/2020  . Pain of upper abdomen 05/21/2020  . Right thigh pain 05/21/2020  . History of COVID-19 03/04/2020    Past Surgical History:  Procedure Laterality Date  . HAND SURGERY    . SHOULDER SURGERY      Social History   Tobacco Use  . Smoking status: Current Some Day Smoker  . Smokeless tobacco: Current User    Types: Chew  Substance Use Topics  . Alcohol use: Yes    Comment: Rarely    Family History  Problem Relation Age of Onset  . Cancer Mother   . Depression Mother   . Hypertension Father   . Restless legs syndrome Father   . Depression Father   . Epilepsy Sister   . Depression Sister      Current medication list and allergy/intolerance information reviewed:    Current Outpatient Medications  Medication Sig Dispense Refill  . buPROPion (WELLBUTRIN XL) 150 MG 24 hr tablet Take 1 tablet (150 mg total) by mouth every morning. 90 tablet 0  . fexofenadine (ALLEGRA) 180 MG tablet Take 180 mg by mouth daily.    . Multiple Vitamin tablet Take 1 tablet by mouth daily.    .  Multiple Vitamins-Minerals (EMERGEN-C IMMUNE PO) Take by mouth daily.    Marland Kitchen. omeprazole (PRILOSEC) 20 MG capsule Take 20 mg by mouth daily.    . sertraline (ZOLOFT) 100 MG tablet Take 1 tablet (100 mg total) by mouth daily. 90 tablet 1   No current facility-administered medications for this visit.    Allergies  Allergen Reactions  . Klonopin [Clonazepam] Other (See Comments)    Increased anxiety      Review of Systems:  Constitutional:  No  fever, no chills, No recent illness, No unintentional weight changes. No significant fatigue.   HEENT: No  headache, no vision change, no hearing change, No sore throat, No  sinus pressure  Cardiac: No  chest pain, No  pressure, No palpitations, No  Orthopnea  Respiratory:  No  shortness of breath. No  Cough  Gastrointestinal: + RUQ abdominal pain, No  nausea, No  vomiting,  No blood in stool, No  diarrhea, No  Constipation, more frequent bowel movements with dietary changes  Musculoskeletal: No new myalgia/arthralgia  Skin: No  Rash, No other wounds/concerning lesions  Genitourinary: No  incontinence, No  abnormal genital bleeding, No abnormal genital discharge  Hem/Onc: No  easy bruising/bleeding, No  abnormal lymph node  Endocrine: No cold intolerance,  No heat intolerance. No polyuria/polydipsia/polyphagia   Neurologic: No  weakness, No  dizziness, No  slurred speech/focal weakness/facial droop  Psychiatric: + concerns with depression, +  concerns with anxiety, No sleep problems, No mood problems  Exam:  BP 123/89   Pulse 82   Temp 98.6 F (37 C)   Ht 6' (1.829 m)   Wt 299 lb 14.4 oz (136 kg)   SpO2 99%   BMI 40.67 kg/m   Constitutional: VS see above. General Appearance: alert, well-developed, well-nourished, NAD  Eyes: Normal lids and conjunctive, non-icteric sclera  Ears, Nose, Mouth, Throat: MMM, Normal external inspection ears/nares/mouth/lips/gums. TM normal bilaterally.  Neck: No masses, trachea midline. No thyroid  enlargement. No tenderness/mass appreciated. No lymphadenopathy  Respiratory: Normal respiratory effort. no wheeze, no rhonchi, no rales  Cardiovascular: S1/S2 normal, no murmur, no rub/gallop auscultated. RRR. No lower extremity edema. No carotid bruit or JVD. No abdominal aortic bruit.  Gastrointestinal: Nontender, no masses. No hepatomegaly, no splenomegaly. No hernia appreciated. Bowel sounds normal. Rectal exam deferred.   Musculoskeletal: Gait normal. No clubbing/cyanosis of digits.   Neurological: Normal balance/coordination. No tremor. No cranial nerve deficit on limited exam. Motor and sensation intact and symmetric. Cerebellar reflexes intact.   Skin: warm, dry, intact. No rash/ulcer. No concerning nevi or subq nodules on limited exam.    Psychiatric: Normal judgment/insight. Normal mood and affect. Oriented x3.    No results found for this or any previous visit (from the past 72 hour(s)).  No results found.   ASSESSMENT/PLAN:   1. Annual physical exam Preventative healthcare labs already drawn, results reviewed with patient and questions answered  2. Encounter for screening for HIV Discuss screening recommendations.  Patient would like to hold off today.  3. Need for hepatitis C screening test Discussed screening recommendations.  Patient willing to hold off today.  4. Need for Tdap vaccination Tdap given in office today. - Tdap vaccine greater than or equal to 7yo IM  5. Anxiety with depression Notes that he has done better since changing the Wellbutrin to once daily extended release.  Doing well on sertraline 100 mg daily.  Does note that his depressive symptoms do seem to be exacerbated at times and wonders if this could be related to testosterone.  We will go ahead and check testosterone today.  Orders Placed This Encounter  Procedures  . Tdap vaccine greater than or equal to 7yo IM  . Testosterone    No orders of the defined types were placed in this  encounter.   Patient Instructions   Tdap (Tetanus, Diphtheria, Pertussis) Vaccine: What You Need to Know 1. Why get vaccinated? Tdap vaccine can prevent tetanus, diphtheria, and pertussis. Diphtheria and pertussis spread from person to person. Tetanus enters the body through cuts or wounds.  TETANUS (T) causes painful stiffening of the muscles. Tetanus can lead to serious health problems, including being unable to open the mouth, having trouble swallowing and breathing, or death.  DIPHTHERIA (D) can lead to difficulty breathing, heart failure, paralysis, or death.  PERTUSSIS (aP), also known as "whooping cough," can cause uncontrollable, violent coughing that makes it hard to breathe, eat, or drink. Pertussis can be extremely serious especially in babies and young children, causing pneumonia, convulsions, brain damage, or death. In teens and adults, it can cause weight loss, loss of bladder control, passing out, and rib fractures from severe coughing. 2. Tdap vaccine Tdap is only for children 7 years and older, adolescents, and adults.  Adolescents should receive a single dose of Tdap, preferably at age 59 or 12 years. Pregnant people should get a dose of Tdap during every pregnancy, preferably during the early part  of the third trimester, to help protect the newborn from pertussis. Infants are most at risk for severe, life-threatening complications from pertussis. Adults who have never received Tdap should get a dose of Tdap. Also, adults should receive a booster dose of either Tdap or Td (a different vaccine that protects against tetanus and diphtheria but not pertussis) every 10 years, or after 5 years in the case of a severe or dirty wound or burn. Tdap may be given at the same time as other vaccines. 3. Talk with your health care provider Tell your vaccine provider if the person getting the vaccine:  Has had an allergic reaction after a previous dose of any vaccine that protects against  tetanus, diphtheria, or pertussis, or has any severe, life-threatening allergies  Has had a coma, decreased level of consciousness, or prolonged seizures within 7 days after a previous dose of any pertussis vaccine (DTP, DTaP, or Tdap)  Has seizures or another nervous system problem  Has ever had Guillain-Barr Syndrome (also called "GBS")  Has had severe pain or swelling after a previous dose of any vaccine that protects against tetanus or diphtheria In some cases, your health care provider may decide to postpone Tdap vaccination until a future visit. People with minor illnesses, such as a cold, may be vaccinated. People who are moderately or severely ill should usually wait until they recover before getting Tdap vaccine.  Your health care provider can give you more information. 4. Risks of a vaccine reaction  Pain, redness, or swelling where the shot was given, mild fever, headache, feeling tired, and nausea, vomiting, diarrhea, or stomachache sometimes happen after Tdap vaccination. People sometimes faint after medical procedures, including vaccination. Tell your provider if you feel dizzy or have vision changes or ringing in the ears.  As with any medicine, there is a very remote chance of a vaccine causing a severe allergic reaction, other serious injury, or death. 5. What if there is a serious problem? An allergic reaction could occur after the vaccinated person leaves the clinic. If you see signs of a severe allergic reaction (hives, swelling of the face and throat, difficulty breathing, a fast heartbeat, dizziness, or weakness), call 9-1-1 and get the person to the nearest hospital. For other signs that concern you, call your health care provider.  Adverse reactions should be reported to the Vaccine Adverse Event Reporting System (VAERS). Your health care provider will usually file this report, or you can do it yourself. Visit the VAERS website at www.vaers.LAgents.no or call (818)241-9032.  VAERS is only for reporting reactions, and VAERS staff members do not give medical advice. 6. The National Vaccine Injury Compensation Program The [employer] Vaccine Injury Compensation Program (VICP) is a federal program that was created to compensate people who may have been injured by certain vaccines. Claims regarding alleged injury or death due to vaccination have a time limit for filing, which may be as short as two years. Visit the VICP website at SpiritualWord.at or call 6038774710 to learn about the program and about filing a claim. 7. How can I learn more?  Ask your health care provider.  Call your local or state health department.  Visit the website of the Food and Drug Administration (FDA) for vaccine package inserts and additional information at FinderList.no.  Contact the Centers for Disease Control and Prevention (CDC): ? Call 863-367-0926 (1-800-CDC-INFO) or ? Visit CDC's website at PicCapture.uy. Vaccine Information Statement Tdap (Tetanus, Diphtheria, Pertussis) Vaccine (12/08/2019) This information is not intended to replace  advice given to you by your health care provider. Make sure you discuss any questions you have with your health care provider. Document Revised: 01/03/2020 Document Reviewed: 01/03/2020 Elsevier Patient Education  2021 Elsevier Inc.   Preventive Care 68-54 Years Old, Male Preventive care refers to lifestyle choices and visits with your health care provider that can promote health and wellness. This includes:  A yearly physical exam. This is also called an annual wellness visit.  Regular dental and eye exams.  Immunizations.  Screening for certain conditions.  Healthy lifestyle choices, such as: ? Eating a healthy diet. ? Getting regular exercise. ? Not using drugs or products that contain nicotine and tobacco. ? Limiting alcohol use. What can I expect for my preventive care  visit? Physical exam Your health care provider will check your:  Height and weight. These may be used to calculate your BMI (body mass index). BMI is a measurement that tells if you are at a healthy weight.  Heart rate and blood pressure.  Body temperature.  Skin for abnormal spots. Counseling Your health care provider may ask you questions about your:  Past medical problems.  Family's medical history.  Alcohol, tobacco, and drug use.  Emotional well-being.  Home life and relationship well-being.  Sexual activity.  Diet, exercise, and sleep habits.  Work and work Astronomer.  Access to firearms. What immunizations do I need? Vaccines are usually given at various ages, according to a schedule. Your health care provider will recommend vaccines for you based on your age, medical history, and lifestyle or other factors, such as travel or where you work.   What tests do I need? Blood tests  Lipid and cholesterol levels. These may be checked every 5 years, or more often if you are over 28 years old.  Hepatitis C test.  Hepatitis B test. Screening  Lung cancer screening. You may have this screening every year starting at age 59 if you have a 30-pack-year history of smoking and currently smoke or have quit within the past 15 years.  Prostate cancer screening. Recommendations will vary depending on your family history and other risks.  Genital exam to check for testicular cancer or hernias.  Colorectal cancer screening. ? All adults should have this screening starting at age 11 and continuing until age 57. ? Your health care provider may recommend screening at age 64 if you are at increased risk. ? You will have tests every 1-10 years, depending on your results and the type of screening test.  Diabetes screening. ? This is done by checking your blood sugar (glucose) after you have not eaten for a while (fasting). ? You may have this done every 1-3 years.  STD  (sexually transmitted disease) testing, if you are at risk. Follow these instructions at home: Eating and drinking  Eat a diet that includes fresh fruits and vegetables, whole grains, lean protein, and low-fat dairy products.  Take vitamin and mineral supplements as recommended by your health care provider.  Do not drink alcohol if your health care provider tells you not to drink.  If you drink alcohol: ? Limit how much you have to 0-2 drinks a day. ? Be aware of how much alcohol is in your drink. In the [place] one drink equals one 12 oz bottle of beer (355 mL), one 5 oz glass of wine (148 mL), or one 1 oz glass of hard liquor (44 mL).   Lifestyle  Take daily care of your teeth and gums. Brush your  teeth every morning and night with fluoride toothpaste. Floss one time each day.  Stay active. Exercise for at least 30 minutes 5 or more days each week.  Do not use any products that contain nicotine or tobacco, such as cigarettes, e-cigarettes, and chewing tobacco. If you need help quitting, ask your health care provider.  Do not use drugs.  If you are sexually active, practice safe sex. Use a condom or other form of protection to prevent STIs (sexually transmitted infections).  If told by your health care provider, take low-dose aspirin daily starting at age 89.  Find healthy ways to cope with stress, such as: ? Meditation, yoga, or listening to music. ? Journaling. ? Talking to a trusted person. ? Spending time with friends and family. Safety  Always wear your seat belt while driving or riding in a vehicle.  Do not drive: ? If you have been drinking alcohol. Do not ride with someone who has been drinking. ? When you are tired or distracted. ? While texting.  Wear a helmet and other protective equipment during sports activities.  If you have firearms in your house, make sure you follow all gun safety procedures. What's next?  Go to your health care provider once a year for  an annual wellness visit.  Ask your health care provider how often you should have your eyes and teeth checked.  Stay up to date on all vaccines. This information is not intended to replace advice given to you by your health care provider. Make sure you discuss any questions you have with your health care provider. Document Revised: 01/17/2019 Document Reviewed: 04/14/2018 Elsevier Patient Education  2021 Elsevier Inc.     Follow-up plan: Return in about 1 year (around 06/04/2021) for annual physical exam.  Thayer Ohm, DNP, APRN, FNP-BC [facility] and [facility]

## 2020-06-20 LAB — TESTOSTERONE: Testosterone: 174 ng/dL — ABNORMAL LOW (ref 250–827)

## 2020-06-25 ENCOUNTER — Encounter: Payer: Self-pay | Admitting: Medical-Surgical

## 2020-06-25 ENCOUNTER — Telehealth (INDEPENDENT_AMBULATORY_CARE_PROVIDER_SITE_OTHER): Payer: BC Managed Care – PPO | Admitting: Medical-Surgical

## 2020-06-25 VITALS — Temp 98.1°F | Wt 299.0 lb

## 2020-06-25 DIAGNOSIS — E291 Testicular hypofunction: Secondary | ICD-10-CM | POA: Diagnosis not present

## 2020-06-25 NOTE — Progress Notes (Signed)
Virtual Visit via Video Note  I connected with Michael Manning on 06/25/20 at  1:00 PM EST by a video enabled telemedicine application and verified that I am speaking with the correct person using two identifiers.   I discussed the limitations of evaluation and management by telemedicine and the availability of in person appointments. The patient expressed understanding and agreed to proceed.  Patient location: home Provider locations: office  Subjective:    CC: discuss testosterone replacement  HPI: Pleasant 35 year old male presenting via MyChart video visit to discuss testosterone replacement options. Recent testosterone level of 174 with a history of hypogonadism and IM testosterone replacement several years ago. Was not able to get in to see his PCP at the time because he lived two hours away so he ended up stopping the injections. Has never tried topical gels but reports his dad has low testosterone and the topicals didn't work for him. Is interested in resuming the IM injections but notes that he would like to do these at home himself. He did receive education several years ago on how to self administer but did not transition to doing these himself at that time.   Past medical history, Surgical history, Family history not pertinant except as noted below, Social history, Allergies, and medications have been entered into the medical record, reviewed, and corrections made.   Review of Systems: See HPI for pertinent positives and negatives.   Objective:    General: Speaking clearly in complete sentences without any shortness of breath.  Alert and oriented x3.  Normal judgment. No apparent acute distress.  Impression and Recommendations:    1. Hypogonadism in male Discussed available replacement options. Since he would like to do IM injections, we will go ahead and order a baseline PSA today for collection when he returns [facility] from [place]. He will get this drawn early next week. Once  that has resulted, advised patient that we can set up a nurse visit to get him started on the IM testosterone. We will verify that he is able to correctly administer his own shots before sending supplies home for him to self-administer. Will need to recheck testosterone levels after 6 weeks at the midpoint between the 3rd and 4th shot to evaluate response and dose appropriateness. Discussed monitoring requirements with every 3 month CBC, PSA, and testosterone levels. Patient verbalized understanding and is agreeable to the plan.   I discussed the assessment and treatment plan with the patient. The patient was provided an opportunity to ask questions and all were answered. The patient agreed with the plan and demonstrated an understanding of the instructions.   The patient was advised to call back or seek an in-person evaluation if the symptoms worsen or if the condition fails to improve as anticipated.  20 minutes of non-face-to-face time was provided during this encounter.  Return for blood draw as discussed then schedule nurse visit for t-shot.  Thayer Ohm, DNP, APRN, FNP-BC [facility] and [facility] Medicine

## 2020-07-01 ENCOUNTER — Ambulatory Visit (INDEPENDENT_AMBULATORY_CARE_PROVIDER_SITE_OTHER): Payer: BC Managed Care – PPO | Admitting: Otolaryngology

## 2020-07-24 ENCOUNTER — Ambulatory Visit (INDEPENDENT_AMBULATORY_CARE_PROVIDER_SITE_OTHER): Payer: BC Managed Care – PPO | Admitting: Otolaryngology

## 2020-08-28 ENCOUNTER — Encounter: Payer: Self-pay | Admitting: Medical-Surgical

## 2020-08-28 ENCOUNTER — Other Ambulatory Visit: Payer: Self-pay

## 2020-08-28 ENCOUNTER — Ambulatory Visit (INDEPENDENT_AMBULATORY_CARE_PROVIDER_SITE_OTHER): Payer: BC Managed Care – PPO | Admitting: Medical-Surgical

## 2020-08-28 VITALS — BP 133/95 | HR 73 | Temp 97.9°F | Ht 72.0 in | Wt 290.1 lb

## 2020-08-28 DIAGNOSIS — R1010 Upper abdominal pain, unspecified: Secondary | ICD-10-CM

## 2020-08-28 DIAGNOSIS — E291 Testicular hypofunction: Secondary | ICD-10-CM | POA: Diagnosis not present

## 2020-08-28 LAB — PSA: PSA: 0.91 ng/mL (ref <=4.00)

## 2020-08-28 MED ORDER — TESTOSTERONE CYPIONATE 100 MG/ML IM SOLN: 100.0000 mg | INTRAMUSCULAR | 1 refills | Status: DC

## 2020-08-28 MED ORDER — "NEEDLE (DISP) 22G X 1-1/2"" MISC": 0 refills | Status: AC

## 2020-08-28 MED ORDER — SYRINGE LUER LOCK 3 ML MISC: 1 refills | Status: AC

## 2020-08-28 MED ORDER — ALCOHOL PADS 70 % PADS: MEDICATED_PAD | 6 refills | Status: AC

## 2020-08-28 MED ORDER — "NEEDLE (DISP) 18G X 1"" MISC": 0 refills | Status: AC

## 2020-08-28 NOTE — Progress Notes (Signed)
Subjective:    CC: Hypogonadism, upper abdomen pain  HPI: Pleasant 35 year old male presenting today to discuss the following:  Hypogonadism-a couple of months ago, we rechecked his testosterone and found it to be 174.  Today he would like to get restarted on testosterone supplements.  We did a CBC recently but all he will need is a PSA to get restarted.  He did do testosterone replacement in the past with injections and knows how to do them on his own.  Does not member his previous dose of testosterone but remembers that he was only doing injections every 3 weeks and it did not seem frequent enough since his symptoms returned during the third week.  Upper abdomen pain-we did a work-up at our last appointment for a cause of his upper abdomen pain but were unable to find any specific causative factors.  Suspect GERD is playing a role here.  He is already taking omeprazole 20 mg twice daily which does help some with his stomach pain but he is interested in being referred to a gastroenterologist for further evaluation and possible endoscopy.  I reviewed the past medical history, family history, social history, surgical history, and allergies today and no changes were needed.  Please see the problem list section below in epic for further details.  Past Medical History: Past Medical History:  Diagnosis Date  . Anxiety   . Depression   . GERD (gastroesophageal reflux disease)   . Herpes    Past Surgical History: Past Surgical History:  Procedure Laterality Date  . HAND SURGERY    . SHOULDER SURGERY     Social History: Social History   Socioeconomic History  . Marital status: Married    Spouse name: Not on file  . Number of children: Not on file  . Years of education: Not on file  . Highest education level: Not on file  Occupational History  . Not on file  Tobacco Use  . Smoking status: Current Some Day Smoker  . Smokeless tobacco: Current User    Types: Chew  Vaping Use  .  Vaping Use: Some days  Substance and Sexual Activity  . Alcohol use: Yes    Comment: Rarely  . Drug use: Not Currently    Types: Marijuana  . Sexual activity: Yes  Other Topics Concern  . Not on file  Social History Narrative   ** Merged History Encounter **       Social Determinants of Health   Financial Resource Strain: Not on file  Food Insecurity: Not on file  Transportation Needs: Not on file  Physical Activity: Not on file  Stress: Not on file  Social Connections: Not on file   Family History: Family History  Problem Relation Age of Onset  . Cancer Mother   . Depression Mother   . Hypertension Father   . Restless legs syndrome Father   . Depression Father   . Epilepsy Sister   . Depression Sister    Allergies: Allergies  Allergen Reactions  . Klonopin [Clonazepam] Other (See Comments)    Increased anxiety   Medications: See med rec.  Review of Systems: See HPI for pertinent positives and negatives.   Objective:    General: Well Developed, well nourished, and in no acute distress.  Neuro: Alert and oriented x3.  HEENT: Normocephalic, atraumatic.  Skin: Warm and dry. Cardiac: Regular rate and rhythm, no murmurs rubs or gallops, no lower extremity edema.  Respiratory: Clear to auscultation bilaterally. Not using accessory  muscles, speaking in full sentences.   Impression and Recommendations:    1. Hypogonadism in male Checking PSA today.  Starting testosterone 100 mg/mL IM every 14 days.  Advised patient that we will need blood work checked 1 week after his third injection so that we can evaluate his response and the need for dose changes. - PSA  2. Pain of upper abdomen Referring to GI per patient request. - Ambulatory referral to Gastroenterology  Return for labs for testosterone 7 days after 3rd injection. ___________________________________________ Thayer Ohm, DNP, APRN, FNP-BC Primary Care and Sports Medicine [facility]  [place]

## 2020-08-29 ENCOUNTER — Encounter: Payer: Self-pay | Admitting: Medical-Surgical

## 2020-08-29 MED ORDER — TESTOSTERONE CYPIONATE 100 MG/ML IM SOLN: 100.0000 mg | INTRAMUSCULAR | 0 refills | Status: AC

## 2020-08-29 NOTE — Addendum Note (Signed)
Addended byChristen Butter on: 08/29/2020 07:24 AM   Modules accepted: Orders

## 2020-08-29 NOTE — Telephone Encounter (Signed)
Order resent for testosterone cypionate 100mg /ml for the 87ml supply. That should be what he needs to be able to use the GoodRx coupon. If any trouble getting it this time, please contact the pharmacy to find out what the trouble is.   9m, DNP, APRN, FNP-BC [facility] and [facility] Medicine

## 2020-09-03 ENCOUNTER — Other Ambulatory Visit: Payer: Self-pay | Admitting: Medical-Surgical

## 2020-12-04 ENCOUNTER — Other Ambulatory Visit: Payer: Self-pay | Admitting: Medical-Surgical
